# Patient Record
Sex: Female | Born: 1970 | Race: White | Hispanic: No | Smoking: Never smoker
Health system: Southern US, Community
[De-identification: ages and names within clinical notes are randomized; demographics above are authoritative.]

## PROBLEM LIST (undated history)

## (undated) DIAGNOSIS — F988 Other specified behavioral and emotional disorders with onset usually occurring in childhood and adolescence: Secondary | ICD-10-CM

## (undated) HISTORY — PX: TUBAL LIGATION: SHX77

---

## 2016-10-15 ENCOUNTER — Emergency Department (HOSPITAL_COMMUNITY): Payer: Worker's Compensation

## 2016-10-15 ENCOUNTER — Emergency Department (HOSPITAL_COMMUNITY)
Admission: EM | Admit: 2016-10-15 | Discharge: 2016-10-15 | Disposition: A | Discharge status: Home / Self Care | Payer: Worker's Compensation | Attending: Emergency Medicine | Admitting: Emergency Medicine

## 2016-10-15 ENCOUNTER — Encounter (HOSPITAL_COMMUNITY): Payer: Self-pay | Admitting: Emergency Medicine

## 2016-10-15 DIAGNOSIS — Z79899 Other long term (current) drug therapy: Secondary | ICD-10-CM | POA: Diagnosis not present

## 2016-10-15 DIAGNOSIS — Y999 Unspecified external cause status: Secondary | ICD-10-CM | POA: Diagnosis not present

## 2016-10-15 DIAGNOSIS — Y939 Activity, unspecified: Secondary | ICD-10-CM | POA: Insufficient documentation

## 2016-10-15 DIAGNOSIS — R10817 Generalized abdominal tenderness: Secondary | ICD-10-CM | POA: Diagnosis not present

## 2016-10-15 DIAGNOSIS — F909 Attention-deficit hyperactivity disorder, unspecified type: Secondary | ICD-10-CM | POA: Diagnosis not present

## 2016-10-15 DIAGNOSIS — S42292A Other displaced fracture of upper end of left humerus, initial encounter for closed fracture: Secondary | ICD-10-CM

## 2016-10-15 DIAGNOSIS — S42212A Unspecified displaced fracture of surgical neck of left humerus, initial encounter for closed fracture: Secondary | ICD-10-CM

## 2016-10-15 DIAGNOSIS — M545 Low back pain: Secondary | ICD-10-CM | POA: Insufficient documentation

## 2016-10-15 DIAGNOSIS — M25552 Pain in left hip: Secondary | ICD-10-CM | POA: Insufficient documentation

## 2016-10-15 DIAGNOSIS — M542 Cervicalgia: Secondary | ICD-10-CM | POA: Diagnosis not present

## 2016-10-15 DIAGNOSIS — R51 Headache: Secondary | ICD-10-CM | POA: Diagnosis not present

## 2016-10-15 DIAGNOSIS — R931 Abnormal findings on diagnostic imaging of heart and coronary circulation: Secondary | ICD-10-CM | POA: Insufficient documentation

## 2016-10-15 DIAGNOSIS — M546 Pain in thoracic spine: Secondary | ICD-10-CM | POA: Diagnosis not present

## 2016-10-15 DIAGNOSIS — Y92410 Unspecified street and highway as the place of occurrence of the external cause: Secondary | ICD-10-CM | POA: Insufficient documentation

## 2016-10-15 DIAGNOSIS — S4992XA Unspecified injury of left shoulder and upper arm, initial encounter: Secondary | ICD-10-CM | POA: Diagnosis present

## 2016-10-15 HISTORY — DX: Other specified behavioral and emotional disorders with onset usually occurring in childhood and adolescence: F98.8

## 2016-10-15 LAB — I-STAT BETA HCG BLOOD, ED (MC, WL, AP ONLY): I-stat hCG, quantitative: <5 m[IU]/mL (ref <5)

## 2016-10-15 MED ORDER — IOPAMIDOL (ISOVUE-300) INJECTION 61%
INTRAVENOUS | Status: AC
  Administered 2016-10-15: 75 mL via INTRAVENOUS
  Filled 2016-10-15: qty 100

## 2016-10-15 MED ORDER — METHOCARBAMOL 500 MG PO TABS: 500.0000 mg | ORAL_TABLET | Freq: Two times a day (BID) | ORAL | 0 refills | Status: AC

## 2016-10-15 MED ORDER — IBUPROFEN 800 MG PO TABS: 800.0000 mg | ORAL_TABLET | Freq: Three times a day (TID) | ORAL | 0 refills | Status: AC

## 2016-10-15 MED ORDER — ACETAMINOPHEN 500 MG PO TABS: 500.0000 mg | ORAL_TABLET | Freq: Four times a day (QID) | ORAL | 0 refills | Status: AC | PRN

## 2016-10-15 MED ORDER — KETOROLAC TROMETHAMINE 30 MG/ML IJ SOLN
30.0000 mg | Freq: Once | INTRAMUSCULAR | Status: AC
  Administered 2016-10-15: 30 mg via INTRAVENOUS
  Filled 2016-10-15: qty 1

## 2016-10-15 MED ORDER — ACETAMINOPHEN 500 MG PO TABS
1000.0000 mg | ORAL_TABLET | Freq: Once | ORAL | Status: AC
  Administered 2016-10-15: 1000 mg via ORAL
  Filled 2016-10-15: qty 2

## 2016-10-15 NOTE — ED Notes (Signed)
ED Provider at bedside. 

## 2016-10-15 NOTE — ED Notes (Signed)
Pt given ginger ale and saltine crackers.  
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
## 2016-10-15 NOTE — ED Notes (Signed)
Patient transported to CT 
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
## 2016-10-15 NOTE — ED Notes (Signed)
Pt to CT and XR
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
## 2016-10-15 NOTE — ED Notes (Signed)
Ortho tech paged regarding application of shoulder sling and immobilizer.
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
## 2016-10-15 NOTE — Progress Notes (Signed)
Orthopedic Tech Progress Note Patient Details:  Jamie SimonsLisa Maxwell Sep 25, 1970 [id number]  Ortho Devices Type of Ortho Device: Sling immobilizer Ortho Device/Splint Location: applied sling immobilizer to pt left arm/shoulder.  pt tolerated well.  left arm Ortho Device/Splint Interventions: Application, Adjustment   Alvina ChouWilliams, Sidi Dzikowski C 10/15/2016, 5:25 PM

## 2016-10-15 NOTE — ED Notes (Signed)
Ice pack given to patient for shoulder pain.

## 2016-10-18 ENCOUNTER — Encounter (HOSPITAL_BASED_OUTPATIENT_CLINIC_OR_DEPARTMENT_OTHER): Payer: Self-pay | Admitting: *Deleted

## 2016-10-18 ENCOUNTER — Emergency Department (HOSPITAL_BASED_OUTPATIENT_CLINIC_OR_DEPARTMENT_OTHER)
Admission: EM | Admit: 2016-10-18 | Discharge: 2016-10-18 | Disposition: A | Discharge status: Home / Self Care | Payer: 59 | Attending: Emergency Medicine | Admitting: Emergency Medicine

## 2016-10-18 DIAGNOSIS — X509XXA Other and unspecified overexertion or strenuous movements or postures, initial encounter: Secondary | ICD-10-CM | POA: Insufficient documentation

## 2016-10-18 DIAGNOSIS — R51 Headache: Secondary | ICD-10-CM

## 2016-10-18 DIAGNOSIS — Z79899 Other long term (current) drug therapy: Secondary | ICD-10-CM | POA: Insufficient documentation

## 2016-10-18 DIAGNOSIS — M542 Cervicalgia: Secondary | ICD-10-CM

## 2016-10-18 DIAGNOSIS — Y929 Unspecified place or not applicable: Secondary | ICD-10-CM | POA: Diagnosis not present

## 2016-10-18 DIAGNOSIS — Y999 Unspecified external cause status: Secondary | ICD-10-CM | POA: Insufficient documentation

## 2016-10-18 DIAGNOSIS — S060X0A Concussion without loss of consciousness, initial encounter: Secondary | ICD-10-CM | POA: Diagnosis not present

## 2016-10-18 DIAGNOSIS — Z791 Long term (current) use of non-steroidal anti-inflammatories (NSAID): Secondary | ICD-10-CM | POA: Diagnosis not present

## 2016-10-18 DIAGNOSIS — R519 Headache, unspecified: Secondary | ICD-10-CM

## 2016-10-18 DIAGNOSIS — Y939 Activity, unspecified: Secondary | ICD-10-CM | POA: Insufficient documentation

## 2016-10-18 MED ORDER — PROMETHAZINE HCL 25 MG PO TABS: 25.0000 mg | ORAL_TABLET | Freq: Four times a day (QID) | ORAL | 0 refills | Status: AC | PRN

## 2016-10-18 MED ORDER — DIAZEPAM 5 MG PO TABS: 2.5000 mg | ORAL_TABLET | Freq: Two times a day (BID) | ORAL | 0 refills | Status: AC | PRN

## 2016-10-18 MED ORDER — DIAZEPAM 2 MG PO TABS
2.0000 mg | ORAL_TABLET | Freq: Once | ORAL | Status: AC
  Administered 2016-10-18: 2 mg via ORAL
  Filled 2016-10-18: qty 1
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
## 2016-10-18 NOTE — ED Notes (Signed)
ED Provider at bedside. 

## 2016-10-23 ENCOUNTER — Telehealth: Payer: Self-pay
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
## 2016-11-05 ENCOUNTER — Ambulatory Visit (INDEPENDENT_AMBULATORY_CARE_PROVIDER_SITE_OTHER): Payer: 59 | Admitting: Family Medicine

## 2016-11-05 ENCOUNTER — Encounter: Payer: Self-pay | Admitting: Family Medicine

## 2016-11-05 DIAGNOSIS — S060X0A Concussion without loss of consciousness, initial encounter: Secondary | ICD-10-CM | POA: Diagnosis not present

## 2016-11-05 DIAGNOSIS — S060X9A Concussion with loss of consciousness of unspecified duration, initial encounter: Secondary | ICD-10-CM | POA: Insufficient documentation

## 2016-11-05 DIAGNOSIS — S060XAA Concussion with loss of consciousness status unknown, initial encounter: Secondary | ICD-10-CM | POA: Insufficient documentation

## 2016-11-05 NOTE — Patient Instructions (Signed)
Good to see you I do think you have a concussion.  I would like you to limit screen time (including your phone) to 30 minutes daily outside of homework.  No sport until you are back in school with no symptoms.  You may then start the return to play protocol I am giving you.  In addition to this I recommend......  To help improve COGNITIVE function: Using fish oil/omega 3 that is 1000 mg (or roughly 600 mg EPA/DHA), starting as soon as possible after concussion, take: 3 tabs THREE TIMES a day  for the first 3 days, then (you will smell a little, sory) 3 tabs TWICE DAILY  for the next 3 days, then 3 tabs ONCE DAILY  for the next 10 days   To help reduce HEADACHES: Coenzyme Q10 160mg  ONCE DAILY Turmeric 500mg  twice daily  May stop after headaches are resolved.                                                                                             To help with INSOMNIA: Tart cherry extract any dose at night  Also iron 65mg  with 500mg  of vitamin C daily     I want to see you again as scheduled.

## 2016-11-05 NOTE — Assessment & Plan Note (Signed)
Patient does have signs and symptoms that is consistent with brain concussion. Posttraumatic headache is also noted. Patient still has some nystagmus noted. Patient is still severely symptomatic. We will retest patient again in 10 days and will start with active recovery. Patient will start to decrease stimulating activities. 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 We discussed icing regimen. Patient will try all the other changes that are also listed in patient instructions and follow-up with me again in 10-14 days.

## 2016-11-15 ENCOUNTER — Ambulatory Visit: Payer: 59 | Admitting: Family Medicine

## 2016-11-27 ENCOUNTER — Ambulatory Visit: Payer: 59 | Admitting: Neurology

## 2017-04-05 ENCOUNTER — Ambulatory Visit: Payer: Worker's Compensation

## 2019-01-28 ENCOUNTER — Other Ambulatory Visit: Payer: Self-pay

## 2019-01-28 ENCOUNTER — Ambulatory Visit: Payer: 59 | Attending: Physician Assistant | Admitting: Audiology

## 2019-01-28 DIAGNOSIS — Z538 Procedure and treatment not carried out for other reasons: Secondary | ICD-10-CM
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
